# Patient Record
Sex: Male | Born: 1985
Health system: Southern US, Community
[De-identification: ages and names within clinical notes are randomized; demographics above are authoritative.]

## PROBLEM LIST (undated history)

## (undated) DIAGNOSIS — N189 Chronic kidney disease, unspecified: Secondary | ICD-10-CM

---

## 2015-11-04 DIAGNOSIS — N028 Recurrent and persistent hematuria with other morphologic changes: Secondary | ICD-10-CM | POA: Diagnosis not present

## 2015-11-04 DIAGNOSIS — R809 Proteinuria, unspecified: Secondary | ICD-10-CM | POA: Diagnosis not present

## 2015-11-04 DIAGNOSIS — R319 Hematuria, unspecified: Secondary | ICD-10-CM | POA: Diagnosis not present

## 2015-11-10 DIAGNOSIS — R809 Proteinuria, unspecified: Secondary | ICD-10-CM | POA: Diagnosis not present

## 2015-11-10 DIAGNOSIS — R319 Hematuria, unspecified: Secondary | ICD-10-CM | POA: Diagnosis not present

## 2015-11-10 DIAGNOSIS — N028 Recurrent and persistent hematuria with other morphologic changes: Secondary | ICD-10-CM | POA: Diagnosis not present

## 2016-01-13 DIAGNOSIS — N028 Recurrent and persistent hematuria with other morphologic changes: Secondary | ICD-10-CM | POA: Diagnosis not present

## 2016-02-27 DIAGNOSIS — R809 Proteinuria, unspecified: Secondary | ICD-10-CM | POA: Diagnosis not present

## 2016-02-27 DIAGNOSIS — N028 Recurrent and persistent hematuria with other morphologic changes: Secondary | ICD-10-CM | POA: Diagnosis not present

## 2016-03-26 DIAGNOSIS — R809 Proteinuria, unspecified: Secondary | ICD-10-CM | POA: Diagnosis not present

## 2016-03-26 DIAGNOSIS — R319 Hematuria, unspecified: Secondary | ICD-10-CM | POA: Diagnosis not present

## 2016-03-26 DIAGNOSIS — N028 Recurrent and persistent hematuria with other morphologic changes: Secondary | ICD-10-CM | POA: Diagnosis not present

## 2016-05-17 DIAGNOSIS — N028 Recurrent and persistent hematuria with other morphologic changes: Secondary | ICD-10-CM | POA: Diagnosis not present

## 2016-05-17 DIAGNOSIS — R809 Proteinuria, unspecified: Secondary | ICD-10-CM | POA: Diagnosis not present

## 2016-08-30 DIAGNOSIS — N028 Recurrent and persistent hematuria with other morphologic changes: Secondary | ICD-10-CM | POA: Diagnosis not present

## 2016-08-30 DIAGNOSIS — R809 Proteinuria, unspecified: Secondary | ICD-10-CM | POA: Diagnosis not present

## 2016-09-05 DIAGNOSIS — R809 Proteinuria, unspecified: Secondary | ICD-10-CM | POA: Diagnosis not present

## 2016-09-05 DIAGNOSIS — R319 Hematuria, unspecified: Secondary | ICD-10-CM | POA: Diagnosis not present

## 2016-09-05 DIAGNOSIS — N028 Recurrent and persistent hematuria with other morphologic changes: Secondary | ICD-10-CM | POA: Diagnosis not present

## 2017-05-24 DIAGNOSIS — I10 Essential (primary) hypertension: Secondary | ICD-10-CM | POA: Diagnosis not present

## 2017-05-24 DIAGNOSIS — Z6826 Body mass index (BMI) 26.0-26.9, adult: Secondary | ICD-10-CM | POA: Diagnosis not present

## 2017-05-24 DIAGNOSIS — Z1389 Encounter for screening for other disorder: Secondary | ICD-10-CM | POA: Diagnosis not present

## 2017-05-24 DIAGNOSIS — N028 Recurrent and persistent hematuria with other morphologic changes: Secondary | ICD-10-CM | POA: Diagnosis not present

## 2017-07-11 DIAGNOSIS — R809 Proteinuria, unspecified: Secondary | ICD-10-CM | POA: Diagnosis not present

## 2017-07-11 DIAGNOSIS — E785 Hyperlipidemia, unspecified: Secondary | ICD-10-CM | POA: Diagnosis not present

## 2017-07-11 DIAGNOSIS — Z6828 Body mass index (BMI) 28.0-28.9, adult: Secondary | ICD-10-CM | POA: Diagnosis not present

## 2017-07-11 DIAGNOSIS — N028 Recurrent and persistent hematuria with other morphologic changes: Secondary | ICD-10-CM | POA: Diagnosis not present

## 2017-07-19 ENCOUNTER — Other Ambulatory Visit (HOSPITAL_COMMUNITY): Payer: Self-pay | Admitting: Nephrology

## 2017-07-19 DIAGNOSIS — E785 Hyperlipidemia, unspecified: Secondary | ICD-10-CM

## 2017-07-19 DIAGNOSIS — R809 Proteinuria, unspecified: Secondary | ICD-10-CM

## 2017-08-09 ENCOUNTER — Ambulatory Visit (HOSPITAL_COMMUNITY): Payer: BLUE CROSS/BLUE SHIELD

## 2017-08-09 ENCOUNTER — Encounter (HOSPITAL_COMMUNITY): Payer: Self-pay

## 2017-09-16 DIAGNOSIS — J029 Acute pharyngitis, unspecified: Secondary | ICD-10-CM | POA: Diagnosis not present

## 2017-09-17 DIAGNOSIS — J020 Streptococcal pharyngitis: Secondary | ICD-10-CM | POA: Diagnosis not present

## 2017-09-17 DIAGNOSIS — R809 Proteinuria, unspecified: Secondary | ICD-10-CM | POA: Diagnosis not present

## 2017-09-17 DIAGNOSIS — N028 Recurrent and persistent hematuria with other morphologic changes: Secondary | ICD-10-CM | POA: Diagnosis not present

## 2017-09-17 DIAGNOSIS — E785 Hyperlipidemia, unspecified: Secondary | ICD-10-CM | POA: Diagnosis not present

## 2017-10-01 DIAGNOSIS — R809 Proteinuria, unspecified: Secondary | ICD-10-CM | POA: Diagnosis not present

## 2017-10-09 ENCOUNTER — Other Ambulatory Visit (HOSPITAL_COMMUNITY): Payer: Self-pay | Admitting: Nephrology

## 2017-10-09 DIAGNOSIS — R809 Proteinuria, unspecified: Secondary | ICD-10-CM

## 2017-11-06 ENCOUNTER — Other Ambulatory Visit: Payer: Self-pay | Admitting: Radiology

## 2017-11-07 ENCOUNTER — Other Ambulatory Visit: Payer: Self-pay | Admitting: Radiology

## 2017-11-08 ENCOUNTER — Ambulatory Visit (HOSPITAL_COMMUNITY)
Admission: RE | Admit: 2017-11-08 | Discharge: 2017-11-08 | Disposition: A | Discharge status: Home / Self Care | Payer: BLUE CROSS/BLUE SHIELD | Source: Ambulatory Visit | Attending: Nephrology | Admitting: Nephrology

## 2017-11-08 ENCOUNTER — Encounter (HOSPITAL_COMMUNITY): Payer: Self-pay

## 2017-11-08 DIAGNOSIS — N028 Recurrent and persistent hematuria with other morphologic changes: Secondary | ICD-10-CM | POA: Diagnosis not present

## 2017-11-08 DIAGNOSIS — R809 Proteinuria, unspecified: Secondary | ICD-10-CM

## 2017-11-08 DIAGNOSIS — Z79899 Other long term (current) drug therapy: Secondary | ICD-10-CM | POA: Insufficient documentation

## 2017-11-08 DIAGNOSIS — F17210 Nicotine dependence, cigarettes, uncomplicated: Secondary | ICD-10-CM | POA: Diagnosis not present

## 2017-11-08 DIAGNOSIS — N189 Chronic kidney disease, unspecified: Secondary | ICD-10-CM | POA: Insufficient documentation

## 2017-11-08 DIAGNOSIS — N289 Disorder of kidney and ureter, unspecified: Secondary | ICD-10-CM | POA: Diagnosis not present

## 2017-11-08 HISTORY — DX: Chronic kidney disease, unspecified: N18.9

## 2017-11-08 LAB — CBC
HEMATOCRIT: 36.9 % — AB (ref 39.0–52.0)
Hemoglobin: 12.9 g/dL — ABNORMAL LOW (ref 13.0–17.0)
MCH: 30.4 pg (ref 26.0–34.0)
MCHC: 35 g/dL (ref 30.0–36.0)
MCV: 87 fL (ref 78.0–100.0)
PLATELETS: 244 10*3/uL (ref 150–400)
RBC: 4.24 MIL/uL (ref 4.22–5.81)
RDW: 12.4 % (ref 11.5–15.5)
WBC: 7.6 10*3/uL (ref 4.0–10.5)

## 2017-11-08 LAB — PROTIME-INR
INR: 0.97
Prothrombin Time: 12.8 seconds (ref 11.4–15.2)

## 2017-11-08 MED ORDER — FENTANYL CITRATE (PF) 100 MCG/2ML IJ SOLN
INTRAMUSCULAR | Status: AC | PRN
  Administered 2017-11-08: 50 ug via INTRAVENOUS
  Administered 2017-11-08: 100 ug via INTRAVENOUS

## 2017-11-08 MED ORDER — SODIUM CHLORIDE 0.9 % IV SOLN: INTRAVENOUS | Status: DC

## 2017-11-08 MED ORDER — MIDAZOLAM HCL 2 MG/2ML IJ SOLN
INTRAMUSCULAR | Status: AC
  Filled 2017-11-08: qty 2

## 2017-11-08 MED ORDER — FENTANYL CITRATE (PF) 100 MCG/2ML IJ SOLN
INTRAMUSCULAR | Status: AC
  Filled 2017-11-08: qty 2

## 2017-11-08 MED ORDER — SODIUM CHLORIDE 0.9 % IV SOLN
INTRAVENOUS | Status: AC | PRN
  Administered 2017-11-08: 10 mL/h via INTRAVENOUS

## 2017-11-08 MED ORDER — LIDOCAINE HCL (PF) 1 % IJ SOLN
INTRAMUSCULAR | Status: AC
  Filled 2017-11-08: qty 30

## 2017-11-08 MED ORDER — GELATIN ABSORBABLE 12-7 MM EX MISC
CUTANEOUS | Status: AC
  Filled 2017-11-08: qty 1

## 2017-11-08 MED ORDER — MIDAZOLAM HCL 2 MG/2ML IJ SOLN
INTRAMUSCULAR | Status: AC | PRN
  Administered 2017-11-08: 1 mg via INTRAVENOUS
  Administered 2017-11-08 (×2): 0.5 mg via INTRAVENOUS

## 2017-11-08 NOTE — Procedures (Signed)
Interventional Radiology Procedure Note  Procedure: US guided biopsy of the left kidney, medical renal. 2x16g core Complications: None Recommendations:  - Ok to shower tomorrow - Do not submerge for 7 days - Routine wound care - 2 hour dc home - advance diet   Signed,  Yvone NeuJaime S. Loreta AveWagner, DO

## 2017-11-08 NOTE — H&P (Signed)
Chief Complaint: Patient was seen in consultation today for random renal biopsy at the request of Dunham,Cynthia  Referring Physician(s): Dunham,Cynthia  Supervising Physician: Gilmer Mor  Patient Status: [facility] - Out-pt  History of Present Illness: Chris Armstrong is a 32 y.o. male   Known proteinuria Intermittent hematuria since 2013 Renal biopsy in [place] 2016: Focal sclerosing IgA nephropathy; Mild interstitial fibrosis  Follows here with Dr Eliott [facility] Scheduled for random renal biopsy   Past Medical History:  Diagnosis Date  . Chronic kidney disease     History reviewed. No pertinent surgical history.  Allergies: Patient has no known allergies.  Medications: Prior to Admission medications   Medication Sig Start Date End Date Taking? Authorizing Provider  atorvastatin (LIPITOR) 20 MG tablet Take 20 mg by mouth every morning.   Yes [provider]  losartan (COZAAR) 25 MG tablet Take 25 mg by mouth 2 (two) times daily.   Yes [provider]  Omega-3 1000 MG CAPS Take 3 capsules by mouth daily.   Yes [provider]  Probiotic Product (PROBIOTIC DAILY PO) Take 1 capsule by mouth daily.   Yes [provider]     Family History  Problem Relation Age of Onset  . Cancer Mother     Social History   Socioeconomic History  . Marital status: Married    Spouse name: Not on file  . Number of children: Not on file  . Years of education: Not on file  . Highest education level: Not on file  Occupational History  . Not on file  Social Needs  . Financial resource strain: Not on file  . Food insecurity:    Worry: Not on file    Inability: Not on file  . Transportation needs:    Medical: Not on file    Non-medical: Not on file  Tobacco Use  . Smoking status: Light Tobacco Smoker    Types: Cigars  . Smokeless tobacco: Never Used  Substance and Sexual Activity  . Alcohol use: Yes    Comment: occasional   . Drug use: Never  . Sexual activity: Not on file  Lifestyle  . Physical activity:    Days per week: Not on file    Minutes per session: Not on file  . Stress: Not on file  Relationships  . Social connections:    Talks on phone: Not on file    Gets together: Not on file    Attends religious service: Not on file    Active member of club or organization: Not on file    Attends meetings of clubs or organizations: Not on file    Relationship status: Not on file  Other Topics Concern  . Not on file  Social History Narrative  . Not on file    Review of Systems: A 12 point ROS discussed and pertinent positives are indicated in the HPI above.  All other systems are negative.  Review of Systems  Constitutional: Negative for activity change, fatigue and fever.  Respiratory: Negative for cough and shortness of breath.   Cardiovascular: Negative for chest pain.  Gastrointestinal: Negative for abdominal pain.  Musculoskeletal: Negative for back pain.  Neurological: Negative for weakness.  Psychiatric/Behavioral: Negative for behavioral problems and confusion.    Vital Signs: BP 121/75   Pulse 63   Temp 98.1 F (36.7 C) (Oral)   Resp 16   Ht 6' (1.829 m)   Wt 195 lb (88.5 kg)   SpO2 100%  BMI 26.45 kg/m   Physical Exam  Constitutional: He is oriented to person, place, and time. He appears well-nourished.  Cardiovascular: Normal rate, regular rhythm and normal heart sounds.  Pulmonary/Chest: Effort normal and breath sounds normal.  Abdominal: Soft. Bowel sounds are normal.  Musculoskeletal: Normal range of motion.  Neurological: He is alert and oriented to person, place, and time.  Skin: Skin is warm and dry.  Psychiatric: He has a normal mood and affect. His behavior is normal. Judgment and thought content normal.  Nursing note and vitals reviewed.   Imaging: No results found.  Labs:  CBC: Recent Labs    11/08/17 0630  WBC 7.6  HGB 12.9*  HCT 36.9*  PLT 244      COAGS: Recent Labs    11/08/17 0630  INR 0.97    BMP: No results for input(s): NA, K, CL, CO2, GLUCOSE, BUN, CALCIUM, CREATININE, GFRNONAA, GFRAA in the last 8760 hours.  Invalid input(s): CMP  LIVER FUNCTION TESTS: No results for input(s): BILITOT, AST, ALT, ALKPHOS, PROT, ALBUMIN in the last 8760 hours.  TUMOR MARKERS: No results for input(s): AFPTM, CEA, CA199, CHROMGRNA in the last 8760 hours.  Assessment and Plan:  Proteinuria Hematuria Scheduled for random renal biopsy Risks and benefits discussed with the patient including, but not limited to bleeding, infection, damage to adjacent structures or low yield requiring additional tests.  All of the patient's questions were answered, patient is agreeable to proceed. Consent signed and in chart.  Thank you for this interesting consult.  I greatly enjoyed meeting Chris Armstrong and look forward to participating in their care.  A copy of this report was sent to the requesting provider on this date.  Electronically Signed: Robet LeuURPIN,Mateus Rewerts A, PA-C 11/08/2017, 7:29 AM   I spent a total of  30 Minutes   in face to face in clinical consultation, greater than 50% of which was counseling/coordinating care for random renal bx

## 2017-11-08 NOTE — Discharge Instructions (Addendum)

## 2017-11-08 NOTE — Sedation Documentation (Signed)
Patient is resting comfortably. 

## 2017-11-28 ENCOUNTER — Encounter (HOSPITAL_COMMUNITY): Payer: Self-pay | Admitting: Chiropractic Medicine

## 2018-01-22 ENCOUNTER — Other Ambulatory Visit: Payer: Self-pay | Admitting: Nephrology

## 2018-01-22 DIAGNOSIS — N028 Recurrent and persistent hematuria with other morphologic changes: Secondary | ICD-10-CM

## 2018-01-28 DIAGNOSIS — N028 Recurrent and persistent hematuria with other morphologic changes: Secondary | ICD-10-CM | POA: Diagnosis not present

## 2018-01-28 DIAGNOSIS — I10 Essential (primary) hypertension: Secondary | ICD-10-CM | POA: Diagnosis not present

## 2018-01-28 DIAGNOSIS — E785 Hyperlipidemia, unspecified: Secondary | ICD-10-CM | POA: Diagnosis not present

## 2018-01-28 DIAGNOSIS — N049 Nephrotic syndrome with unspecified morphologic changes: Secondary | ICD-10-CM | POA: Diagnosis not present

## 2018-01-28 DIAGNOSIS — R809 Proteinuria, unspecified: Secondary | ICD-10-CM | POA: Diagnosis not present

## 2018-01-31 ENCOUNTER — Other Ambulatory Visit: Payer: BLUE CROSS/BLUE SHIELD

## 2018-04-29 DIAGNOSIS — N049 Nephrotic syndrome with unspecified morphologic changes: Secondary | ICD-10-CM | POA: Diagnosis not present

## 2018-04-29 DIAGNOSIS — I10 Essential (primary) hypertension: Secondary | ICD-10-CM | POA: Diagnosis not present

## 2018-04-29 DIAGNOSIS — E785 Hyperlipidemia, unspecified: Secondary | ICD-10-CM | POA: Diagnosis not present

## 2018-04-29 DIAGNOSIS — N028 Recurrent and persistent hematuria with other morphologic changes: Secondary | ICD-10-CM | POA: Diagnosis not present

## 2018-07-02 DIAGNOSIS — E785 Hyperlipidemia, unspecified: Secondary | ICD-10-CM | POA: Diagnosis not present

## 2018-07-02 DIAGNOSIS — N028 Recurrent and persistent hematuria with other morphologic changes: Secondary | ICD-10-CM | POA: Diagnosis not present

## 2018-07-02 DIAGNOSIS — I10 Essential (primary) hypertension: Secondary | ICD-10-CM | POA: Diagnosis not present

## 2018-12-29 DIAGNOSIS — N049 Nephrotic syndrome with unspecified morphologic changes: Secondary | ICD-10-CM | POA: Diagnosis not present

## 2018-12-29 DIAGNOSIS — E785 Hyperlipidemia, unspecified: Secondary | ICD-10-CM | POA: Diagnosis not present

## 2018-12-29 DIAGNOSIS — N028 Recurrent and persistent hematuria with other morphologic changes: Secondary | ICD-10-CM | POA: Diagnosis not present

## 2018-12-31 DIAGNOSIS — I10 Essential (primary) hypertension: Secondary | ICD-10-CM | POA: Diagnosis not present

## 2018-12-31 DIAGNOSIS — N028 Recurrent and persistent hematuria with other morphologic changes: Secondary | ICD-10-CM | POA: Diagnosis not present

## 2018-12-31 DIAGNOSIS — E785 Hyperlipidemia, unspecified: Secondary | ICD-10-CM | POA: Diagnosis not present

## 2019-01-30 DIAGNOSIS — Z20828 Contact with and (suspected) exposure to other viral communicable diseases: Secondary | ICD-10-CM | POA: Diagnosis not present

## 2019-04-28 DIAGNOSIS — I10 Essential (primary) hypertension: Secondary | ICD-10-CM | POA: Diagnosis not present

## 2019-04-28 DIAGNOSIS — L237 Allergic contact dermatitis due to plants, except food: Secondary | ICD-10-CM | POA: Diagnosis not present

## 2019-06-05 DIAGNOSIS — Z0000 Encounter for general adult medical examination without abnormal findings: Secondary | ICD-10-CM | POA: Diagnosis not present

## 2019-06-09 DIAGNOSIS — E7800 Pure hypercholesterolemia, unspecified: Secondary | ICD-10-CM | POA: Diagnosis not present

## 2019-06-09 DIAGNOSIS — N028 Recurrent and persistent hematuria with other morphologic changes: Secondary | ICD-10-CM | POA: Diagnosis not present

## 2019-06-09 DIAGNOSIS — N049 Nephrotic syndrome with unspecified morphologic changes: Secondary | ICD-10-CM | POA: Diagnosis not present

## 2019-06-09 DIAGNOSIS — Z1331 Encounter for screening for depression: Secondary | ICD-10-CM | POA: Diagnosis not present

## 2019-06-09 DIAGNOSIS — Z0000 Encounter for general adult medical examination without abnormal findings: Secondary | ICD-10-CM | POA: Diagnosis not present

## 2019-06-09 DIAGNOSIS — I10 Essential (primary) hypertension: Secondary | ICD-10-CM | POA: Diagnosis not present

## 2021-08-28 ENCOUNTER — Other Ambulatory Visit: Payer: Self-pay | Admitting: Family Medicine

## 2021-08-28 DIAGNOSIS — R109 Unspecified abdominal pain: Secondary | ICD-10-CM

## 2021-09-11 ENCOUNTER — Other Ambulatory Visit: Payer: BLUE CROSS/BLUE SHIELD

## 2021-09-19 ENCOUNTER — Ambulatory Visit
Admission: RE | Admit: 2021-09-19 | Discharge: 2021-09-19 | Disposition: A | Discharge status: Home / Self Care | Payer: Commercial Managed Care - PPO | Source: Ambulatory Visit | Attending: Family Medicine | Admitting: Family Medicine

## 2021-09-19 DIAGNOSIS — R109 Unspecified abdominal pain: Secondary | ICD-10-CM

## 2021-09-25 ENCOUNTER — Other Ambulatory Visit: Payer: Self-pay | Admitting: Family Medicine

## 2021-10-09 ENCOUNTER — Other Ambulatory Visit: Payer: Self-pay | Admitting: Family Medicine

## 2021-10-09 DIAGNOSIS — R161 Splenomegaly, not elsewhere classified: Secondary | ICD-10-CM

## 2021-10-09 DIAGNOSIS — R109 Unspecified abdominal pain: Secondary | ICD-10-CM

## 2021-10-25 ENCOUNTER — Ambulatory Visit
Admission: RE | Admit: 2021-10-25 | Discharge: 2021-10-25 | Disposition: A | Discharge status: Home / Self Care | Payer: Commercial Managed Care - PPO | Source: Ambulatory Visit | Attending: Family Medicine | Admitting: Family Medicine

## 2021-10-25 DIAGNOSIS — R109 Unspecified abdominal pain: Secondary | ICD-10-CM

## 2021-10-25 DIAGNOSIS — R161 Splenomegaly, not elsewhere classified: Secondary | ICD-10-CM

## 2021-10-25 MED ORDER — IOPAMIDOL (ISOVUE-300) INJECTION 61%
80.0000 mL | Freq: Once | INTRAVENOUS | Status: AC | PRN
  Administered 2021-10-25: 80 mL via INTRAVENOUS

## 2021-11-02 ENCOUNTER — Other Ambulatory Visit: Payer: Commercial Managed Care - PPO

## 2021-11-13 ENCOUNTER — Other Ambulatory Visit: Payer: Self-pay | Admitting: Family Medicine

## 2021-11-13 ENCOUNTER — Ambulatory Visit
Admission: RE | Admit: 2021-11-13 | Discharge: 2021-11-13 | Disposition: A | Discharge status: Home / Self Care | Payer: Commercial Managed Care - PPO | Source: Ambulatory Visit | Attending: Family Medicine | Admitting: Family Medicine

## 2021-11-13 DIAGNOSIS — M7989 Other specified soft tissue disorders: Secondary | ICD-10-CM

## 2022-01-02 ENCOUNTER — Ambulatory Visit (HOSPITAL_COMMUNITY)
Admission: EM | Admit: 2022-01-02 | Discharge: 2022-01-02 | Disposition: A | Discharge status: Home / Self Care | Payer: 59

## 2022-01-02 ENCOUNTER — Encounter (HOSPITAL_COMMUNITY): Payer: Self-pay

## 2022-01-02 DIAGNOSIS — J029 Acute pharyngitis, unspecified: Secondary | ICD-10-CM

## 2022-01-02 MED ORDER — AMOXICILLIN 500 MG PO CAPS: 500.0000 mg | ORAL_CAPSULE | Freq: Two times a day (BID) | ORAL | 0 refills | Status: AC

## 2022-01-02 NOTE — ED Triage Notes (Signed)
Sore throat for 2 days. Wife and son tested positive for strep.

## 2022-01-02 NOTE — Discharge Instructions (Signed)
Today we will treat you prophylactically for strep as 2 minutes of your current household have it  Take amoxicillin twice daily for the next 10 days, ideally should see him remain in about 48 hours and steady progression from there  You may continue your vitamin C and increase fluid intake if that is helpful  You may also attempt Tylenol or ibuprofen every 6 hours as needed for additional comfort  You may attempt salt water gargles, throat lozenges, Listerine rinses, warm liquids, soft foods, teaspoons of honey, throat lozenges and over-the-counter Chloraseptic spray for additional comfort  You may follow-up with urgent care as needed symptoms continue to persist or worsen

## 2022-01-02 NOTE — ED Provider Notes (Signed)
[facility]    CSN: [id number] Arrival date & time: 01/02/22  1950      History   Chief Complaint Chief Complaint  Patient presents with   Sore Throat    HPI Chris Armstrong is a 36 y.o. male.   Patient presents with sore throat and nasal congestion for 2 days.  Tolerating food and liquids.  Known sick contacts with strep in household.  Has attempted use of vitamin C and increase fluid intake which has been minimally helpful.  Denies fever, chills, body aches, ear pain, headache, cough, shortness of breath, wheezing, difficulty breathing.  History of CKD.    Past Medical History:  Diagnosis Date   Chronic kidney disease     There are no problems to display for this patient.   History reviewed. No pertinent surgical history.     Home Medications    Prior to Admission medications   Medication Sig Start Date End Date Taking? Authorizing Provider  amoxicillin (AMOXIL) 500 MG capsule Take 1 capsule (500 mg total) by mouth 2 (two) times daily for 10 days. 01/02/22 01/12/22 Yes Ritesh Opara R, NP  atorvastatin (LIPITOR) 20 MG tablet Take 20 mg by mouth every morning.   Yes [provider]  FARXIGA 10 MG TABS tablet Take 10 mg by mouth every morning. 11/15/21  Yes [provider]  losartan (COZAAR) 25 MG tablet Take 25 mg by mouth 2 (two) times daily.   Yes [provider]  Omega-3 1000 MG CAPS Take 3 capsules by mouth daily.    [provider]  Probiotic Product (PROBIOTIC DAILY PO) Take 1 capsule by mouth daily.    [provider]    Family History Family History  Problem Relation Age of Onset   Cancer Mother     Social History Social History   Tobacco Use   Smoking status: Light Smoker    Types: Cigars   Smokeless tobacco: Never  Vaping Use   Vaping Use: Never used  Substance Use Topics   Alcohol use: Yes    Comment: occasional   Drug use: Never     Allergies   Patient has no known  allergies.   Review of Systems Review of Systems Defer to HPI   Physical Exam Triage Vital Signs ED Triage Vitals  Enc Vitals Group     BP 01/02/22 2001 (!) 134/92     Pulse Rate 01/02/22 2001 68     Resp --      Temp 01/02/22 2001 98.2 F (36.8 C)     Temp Source 01/02/22 2001 Oral     SpO2 01/02/22 2001 98 %     Weight 01/02/22 2000 190 lb (86.2 kg)     Height 01/02/22 2000 6' (1.829 m)     Head Circumference --      Peak Flow --      Pain Score 01/02/22 1959 6     Pain Loc --      Pain Edu? --      Excl. in GC? --    No data found.  Updated Vital Signs BP (!) 134/92 (BP Location: Right Arm)   Pulse 68   Temp 98.2 F (36.8 C) (Oral)   Ht 6' (1.829 m)   Wt 190 lb (86.2 kg)   SpO2 98%   BMI 25.77 kg/m   Visual Acuity Right Eye Distance:   Left Eye Distance:   Bilateral Distance:    Right Eye Near:  Left Eye Near:    Bilateral Near:     Physical Exam Constitutional:      Appearance: He is well-developed.  HENT:     Head: Normocephalic.     Right Ear: Tympanic membrane and ear canal normal.     Left Ear: Tympanic membrane and ear canal normal.     Nose: Congestion present. No rhinorrhea.     Mouth/Throat:     Mouth: Mucous membranes are moist.     Pharynx: Posterior oropharyngeal erythema present.     Tonsils: No tonsillar exudate. 0 on the right. 0 on the left.  Cardiovascular:     Rate and Rhythm: Normal rate and regular rhythm.     Heart sounds: Normal heart sounds.  Pulmonary:     Effort: Pulmonary effort is normal.     Breath sounds: Normal breath sounds.  Musculoskeletal:     Cervical back: Normal range of motion and neck supple.  Skin:    General: Skin is warm and dry.  Neurological:     General: No focal deficit present.     Mental Status: He is alert and oriented to person, place, and time.  Psychiatric:        Mood and Affect: Mood normal.        Behavior: Behavior normal.      UC Treatments / Results  Labs (all labs ordered  are listed, but only abnormal results are displayed) Labs Reviewed - No data to display  EKG   Radiology No results found.  Procedures Procedures (including critical care time)  Medications Ordered in UC Medications - No data to display  Initial Impression / Assessment and Plan / UC Course  I have reviewed the triage vital signs and the nursing notes.  Pertinent labs & imaging results that were available during my care of the patient were reviewed by me and considered in my medical decision making (see chart for details).  Sore throat  Vital signs are stable, patient is in no signs of distress, will prophylactically treat for strep as members of household have current illness, amoxicillin 10-day course prescribed, recommended can continue use of current medications if he deems helpful, may attempt additional over-the-counter medications as needed additional supportive care, may follow-up with his urgent care as Final Clinical Impressions(s) / UC Diagnoses   Final diagnoses:  Sore throat     Discharge Instructions      Today we will treat you prophylactically for strep as 2 minutes of your current household have it  Take amoxicillin twice daily for the next 10 days, ideally should see him remain in about 48 hours and steady progression from there  You may continue your vitamin C and increase fluid intake if that is helpful  You may also attempt Tylenol or ibuprofen every 6 hours as needed for additional comfort  You may attempt salt water gargles, throat lozenges, Listerine rinses, warm liquids, soft foods, teaspoons of honey, throat lozenges and over-the-counter Chloraseptic spray for additional comfort  You may follow-up with urgent care as needed symptoms continue to persist or worsen   ED Prescriptions     Medication Sig Dispense Auth. Provider   amoxicillin (AMOXIL) 500 MG capsule Take 1 capsule (500 mg total) by mouth 2 (two) times daily for 10 days. 20 capsule  Valinda Hoar, NP      PDMP not reviewed this encounter.   Valinda Hoar, [place] 01/05/22 [phone]

## 2022-08-29 ENCOUNTER — Other Ambulatory Visit: Payer: Self-pay | Admitting: Gastroenterology

## 2022-08-29 DIAGNOSIS — R899 Unspecified abnormal finding in specimens from other organs, systems and tissues: Secondary | ICD-10-CM

## 2022-09-25 ENCOUNTER — Other Ambulatory Visit: Payer: 59

## 2022-10-08 ENCOUNTER — Other Ambulatory Visit (HOSPITAL_COMMUNITY): Payer: Self-pay | Admitting: Gastroenterology

## 2022-10-08 DIAGNOSIS — R748 Abnormal levels of other serum enzymes: Secondary | ICD-10-CM

## 2022-11-06 ENCOUNTER — Encounter (HOSPITAL_COMMUNITY): Payer: Self-pay

## 2022-11-06 ENCOUNTER — Encounter (HOSPITAL_COMMUNITY): Payer: Managed Care, Other (non HMO)

## 2022-11-26 ENCOUNTER — Encounter (HOSPITAL_COMMUNITY)
Admission: RE | Admit: 2022-11-26 | Discharge: 2022-11-26 | Disposition: A | Discharge status: Home / Self Care | Payer: Managed Care, Other (non HMO) | Source: Ambulatory Visit | Attending: Gastroenterology | Admitting: Gastroenterology

## 2022-11-26 DIAGNOSIS — R748 Abnormal levels of other serum enzymes: Secondary | ICD-10-CM | POA: Diagnosis present

## 2022-11-26 MED ORDER — TECHNETIUM TC 99M MEDRONATE IV KIT
21.2000 | PACK | Freq: Once | INTRAVENOUS | Status: AC | PRN
  Administered 2022-11-26: 21.2 via INTRAVENOUS

## 2023-09-03 IMAGING — CR DG FOOT COMPLETE 3+V*R*
3 series · 3 of 3 positions shown · non-contrast
Comparison: None.

CLINICAL DATA: Right foot swelling

EXAM:
RIGHT FOOT COMPLETE - 3+ VIEW

[x foot ap right]
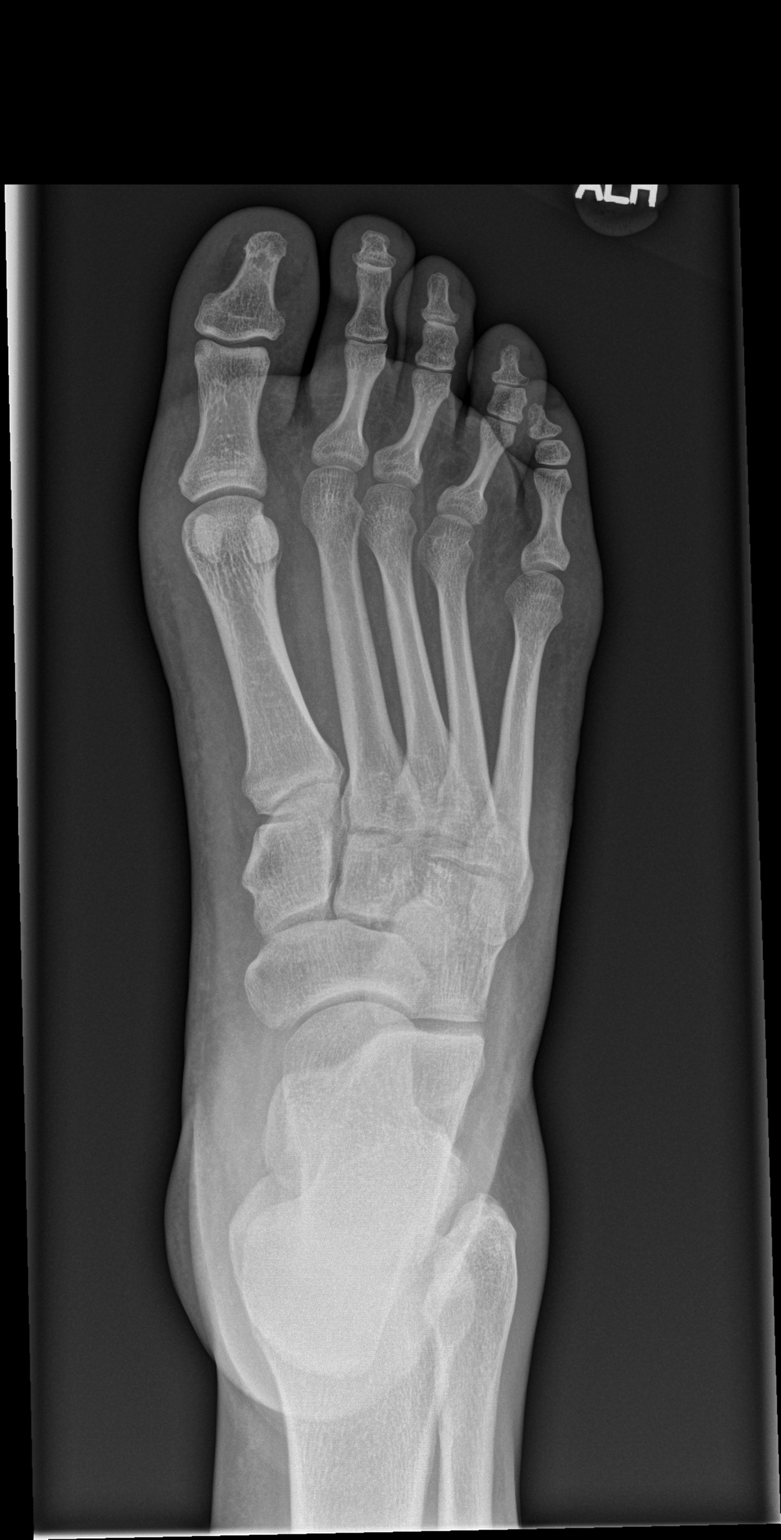

[x foot obl right]
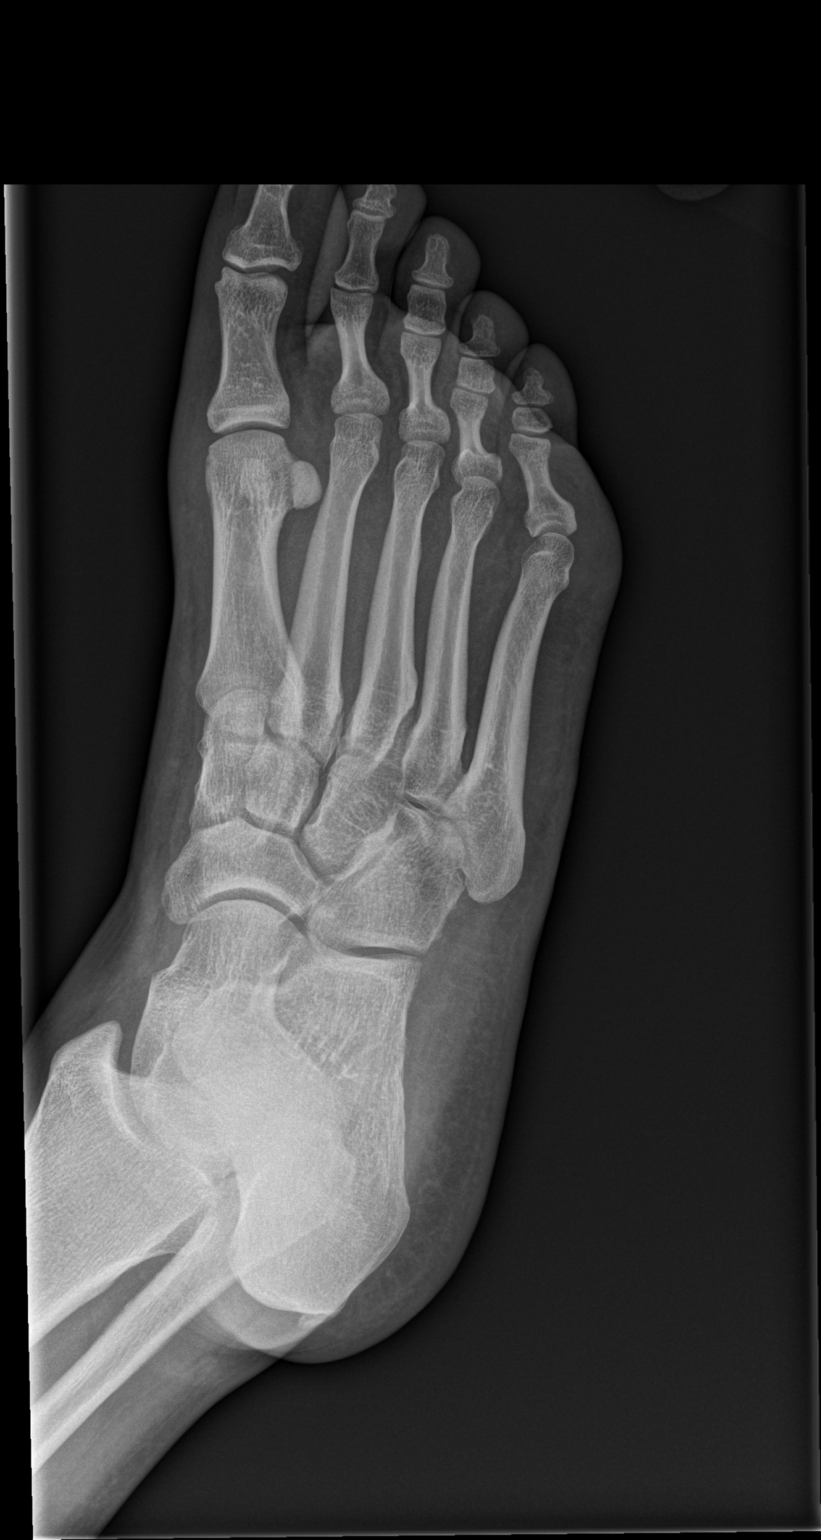

[x foot lat right]
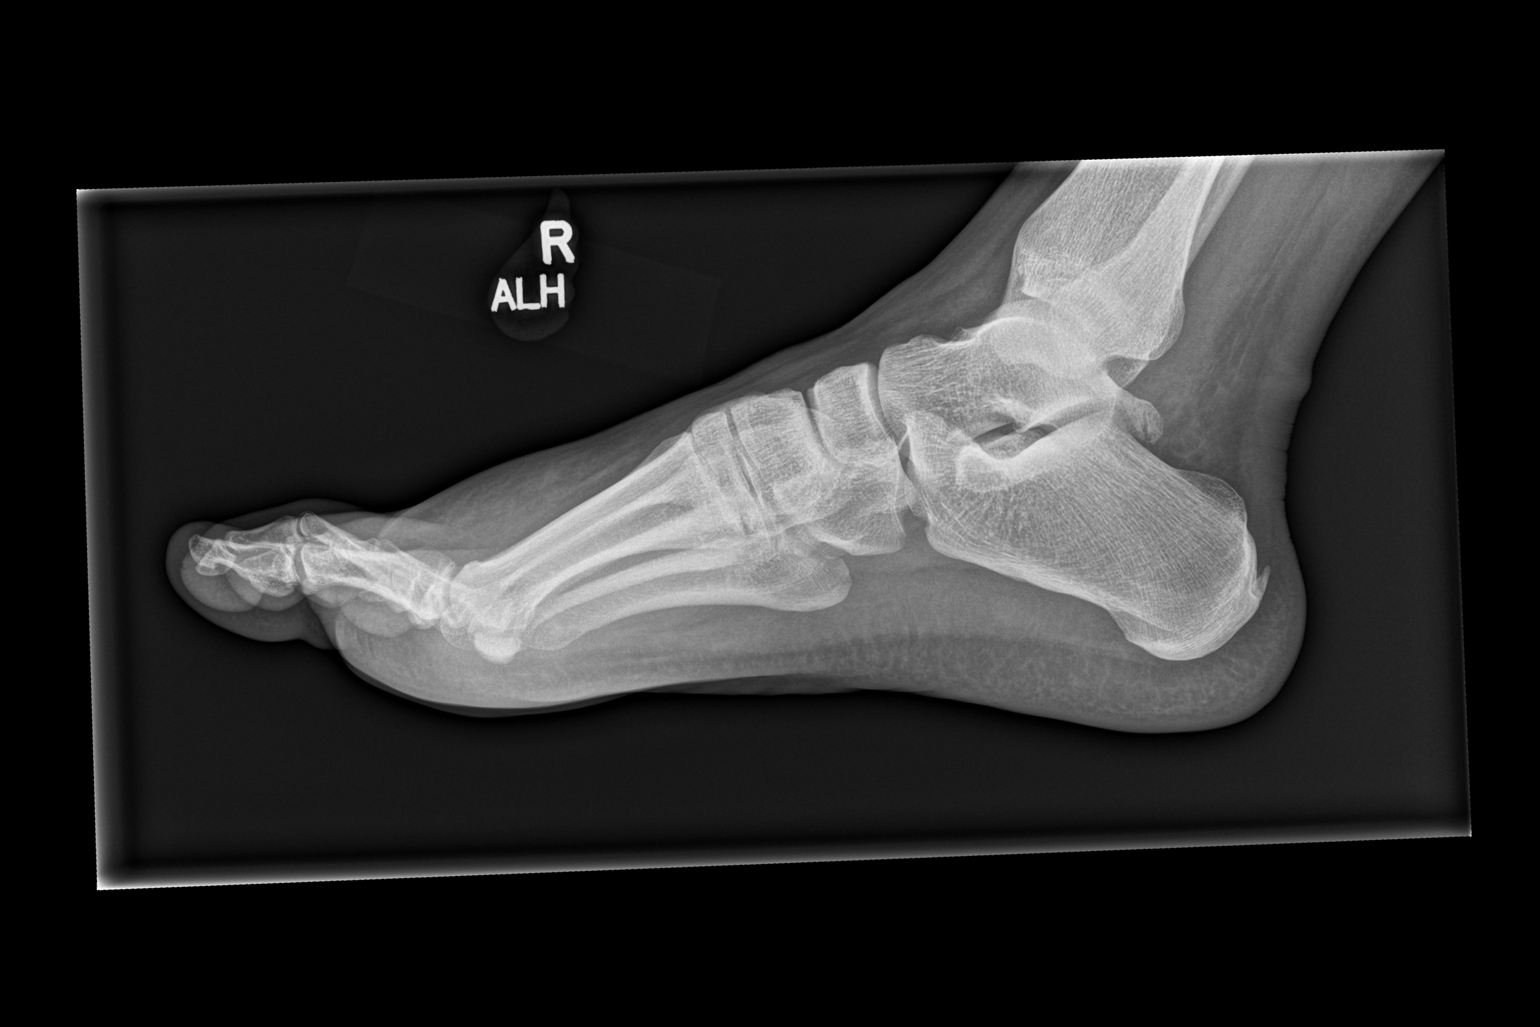

[3 of 3 positions shown; findings below may reference images not displayed]

FINDINGS: Normal alignment. No acute fracture or dislocation. Joint spaces are
preserved. There is moderate soft tissue swelling of the right
forefoot. Tiny superior calcaneal spur.
IMPRESSION: Soft tissue swelling of the right forefoot. No fracture or
dislocation.

## 2023-10-31 ENCOUNTER — Other Ambulatory Visit: Payer: Self-pay | Admitting: Nephrology

## 2023-10-31 ENCOUNTER — Ambulatory Visit
Admission: RE | Admit: 2023-10-31 | Discharge: 2023-10-31 | Disposition: A | Discharge status: Home / Self Care | Source: Ambulatory Visit | Attending: Nephrology | Admitting: Nephrology

## 2023-10-31 DIAGNOSIS — Z111 Encounter for screening for respiratory tuberculosis: Secondary | ICD-10-CM

## 2023-11-06 ENCOUNTER — Other Ambulatory Visit: Payer: Self-pay | Admitting: Nephrology

## 2023-11-06 ENCOUNTER — Ambulatory Visit
Admission: RE | Admit: 2023-11-06 | Discharge: 2023-11-06 | Disposition: A | Discharge status: Home / Self Care | Source: Ambulatory Visit | Attending: Nephrology | Admitting: Nephrology

## 2023-11-06 DIAGNOSIS — K5900 Constipation, unspecified: Secondary | ICD-10-CM

## 2023-11-07 ENCOUNTER — Encounter (HOSPITAL_COMMUNITY)
Admission: RE | Disposition: A | Discharge status: Home / Self Care | Payer: Self-pay | Source: Home / Self Care | Attending: Nephrology

## 2023-11-07 ENCOUNTER — Other Ambulatory Visit: Payer: Self-pay

## 2023-11-07 ENCOUNTER — Ambulatory Visit (HOSPITAL_COMMUNITY)
Admission: RE | Admit: 2023-11-07 | Discharge: 2023-11-07 | Disposition: A | Discharge status: Home / Self Care | Attending: Nephrology | Admitting: Nephrology

## 2023-11-07 DIAGNOSIS — Z79899 Other long term (current) drug therapy: Secondary | ICD-10-CM | POA: Insufficient documentation

## 2023-11-07 DIAGNOSIS — I120 Hypertensive chronic kidney disease with stage 5 chronic kidney disease or end stage renal disease: Secondary | ICD-10-CM | POA: Insufficient documentation

## 2023-11-07 DIAGNOSIS — F17290 Nicotine dependence, other tobacco product, uncomplicated: Secondary | ICD-10-CM | POA: Insufficient documentation

## 2023-11-07 DIAGNOSIS — N186 End stage renal disease: Secondary | ICD-10-CM | POA: Diagnosis not present

## 2023-11-07 DIAGNOSIS — D631 Anemia in chronic kidney disease: Secondary | ICD-10-CM | POA: Insufficient documentation

## 2023-11-07 DIAGNOSIS — Z992 Dependence on renal dialysis: Secondary | ICD-10-CM | POA: Insufficient documentation

## 2023-11-07 HISTORY — PX: DIALYSIS/PERMA CATHETER INSERTION: CATH118288

## 2023-11-07 SURGERY — DIALYSIS/PERMA CATHETER INSERTION
Anesthesia: LOCAL

## 2023-11-07 MED ORDER — LIDOCAINE HCL (PF) 1 % IJ SOLN
INTRAMUSCULAR | Status: AC
  Filled 2023-11-07: qty 30

## 2023-11-07 MED ORDER — LIDOCAINE HCL (PF) 1 % IJ SOLN
INTRAMUSCULAR | Status: DC | PRN
  Administered 2023-11-07: 2 mL via SUBCUTANEOUS
  Administered 2023-11-07: 10 mL via SUBCUTANEOUS
  Administered 2023-11-07: 2 mL via SUBCUTANEOUS

## 2023-11-07 MED ORDER — IODIXANOL 320 MG/ML IV SOLN
INTRAVENOUS | Status: DC | PRN
  Administered 2023-11-07: 2 mL via INTRAVENOUS

## 2023-11-07 MED ORDER — FENTANYL CITRATE (PF) 100 MCG/2ML IJ SOLN
INTRAMUSCULAR | Status: AC
  Filled 2023-11-07: qty 2

## 2023-11-07 MED ORDER — HEPARIN SODIUM (PORCINE) 1000 UNIT/ML IJ SOLN
INTRAMUSCULAR | Status: AC
  Filled 2023-11-07: qty 10

## 2023-11-07 MED ORDER — ACETAMINOPHEN 325 MG PO TABS
ORAL_TABLET | ORAL | Status: AC
  Administered 2023-11-07: 650 mg via ORAL
  Filled 2023-11-07: qty 2

## 2023-11-07 MED ORDER — MIDAZOLAM HCL 2 MG/2ML IJ SOLN
INTRAMUSCULAR | Status: DC | PRN
  Administered 2023-11-07: 2 mg via INTRAVENOUS

## 2023-11-07 MED ORDER — MIDAZOLAM HCL 2 MG/2ML IJ SOLN
INTRAMUSCULAR | Status: AC
  Filled 2023-11-07: qty 2

## 2023-11-07 MED ORDER — ACETAMINOPHEN 325 MG PO TABS: 650.0000 mg | ORAL_TABLET | Freq: Four times a day (QID) | ORAL | Status: DC | PRN

## 2023-11-07 MED ORDER — SODIUM CHLORIDE 0.9 % IV SOLN: INTRAVENOUS | Status: DC

## 2023-11-07 MED ORDER — HEPARIN (PORCINE) IN NACL 1000-0.9 UT/500ML-% IV SOLN
INTRAVENOUS | Status: DC | PRN
  Administered 2023-11-07: 500 mL

## 2023-11-07 MED ORDER — HEPARIN SODIUM (PORCINE) 1000 UNIT/ML IJ SOLN
INTRAMUSCULAR | Status: DC | PRN
  Administered 2023-11-07 (×2): 1900 [IU] via INTRAVENOUS

## 2023-11-07 MED ORDER — FENTANYL CITRATE (PF) 100 MCG/2ML IJ SOLN
INTRAMUSCULAR | Status: DC | PRN
  Administered 2023-11-07: 50 ug via INTRAVENOUS

## 2023-11-07 SURGICAL SUPPLY — 6 items
BAG SNAP BAND KOVER 36X36 (MISCELLANEOUS) IMPLANT
CATH PALINDROME-P 23 W/VT (CATHETERS) IMPLANT
GLIDEWIRE STIFF .35X180X3 HYDR (WIRE) IMPLANT
PACK EP LF (CUSTOM PROCEDURE TRAY) IMPLANT
SHEATH PROBE COVER 6X72 (BAG) IMPLANT
WIRE MICRO SET SILHO 5FR 7 (SHEATH) IMPLANT

## 2023-11-07 NOTE — H&P (Signed)
 Chief Complaint: Need for dialysis access, PD catheter malfunction HPI:  38 year old man with past medical history significant for hypertension and end-stage renal disease who presents for placement of a tunneled hemodialysis catheter.  He was in the process of being trained for peritoneal dialysis however, started having catheter malfunction last week leading to manipulation/repositioning this week but problems with catheter function persist with poor drainage and evidence of volume excess including scrotal edema.  Other than for mild postoperative abdominal discomfort and scrotal discomfort, he does not voice any complaints.  He denies any chest pain or shortness of breath at this time.  The procedure was explained to him and he consents to proceed after weighing risks and benefits.  Past Medical History:  Diagnosis Date   Chronic kidney disease     No past surgical history on file.  Family History  Problem Relation Age of Onset   Cancer Mother    Social History:  reports that he has been smoking cigars. He has never used smokeless tobacco. He reports current alcohol use. He reports that he does not use drugs.  Allergies: No Known Allergies  Medications Prior to Admission  Medication Sig Dispense Refill   atorvastatin (LIPITOR) 20 MG tablet Take 20 mg by mouth every morning.     FARXIGA 10 MG TABS tablet Take 10 mg by mouth every morning.     losartan (COZAAR) 25 MG tablet Take 25 mg by mouth 2 (two) times daily.     Omega-3 1000 MG CAPS Take 3 capsules by mouth daily.     Probiotic Product (PROBIOTIC DAILY PO) Take 1 capsule by mouth daily.      No results found for this or any previous visit (from the past 48 hours). No results found.  Review of Systems  All other systems reviewed and are negative.   There were no vitals taken for this visit. Physical Exam Vitals and nursing note reviewed.  Constitutional:      Appearance: Normal appearance. He is normal weight.  HENT:      Head: Normocephalic and atraumatic.     Right Ear: External ear normal.     Left Ear: External ear normal.     Nose: Nose normal.     Mouth/Throat:     Mouth: Mucous membranes are dry.     Pharynx: Oropharynx is clear.  Eyes:     Extraocular Movements: Extraocular movements intact.     Conjunctiva/sclera: Conjunctivae normal.  Cardiovascular:     Rate and Rhythm: Normal rate and regular rhythm.     Heart sounds: Normal heart sounds.  Abdominal:     General: Abdomen is flat. There is distension.     Comments: Mild distention, PD catheter in situ  Neurological:     Mental Status: He is alert.      Assessment/Plan 1.  End-stage renal disease: Unfortunately with PD catheter malfunction and will have tunneled hemodialysis catheter placement today with the intention of starting him on hemodialysis via the transitional care unit tomorrow. 2.  Hypertension: Blood pressures will be monitored through the procedure with moderate sedation. 3.  Volume overload: With evidence of scrotal edema/fluid retention from suboptimal PD catheter function.  Monitor with hemodialysis/ultrafiltration.   4.  Anemia: Denies overt blood loss, will monitor H/H and ESA at dialysis.  Dagoberto Ligas, MD 11/07/2023, 1:01 PM

## 2023-11-07 NOTE — Op Note (Addendum)
 Patient presents for tunneled catheter insertion to initiate hemodialysis; ultrasound shows a good right IJ. The decision was made to place a RIJ tunneled dialysis catheter.  Summary:  1) Successful placement of a new 23 cm cuff to tip hemodialysis catheter (Palindrome) in the right internal jugular vein with the tip in the right atrium.  2) Recommendations to the dialysis unit TO NOT USE HEPARIN FOR THE FIRST 24 HOURS.  Description of procedure: The right neck, chest and the catheter were prepped and draped in the usual sterile fashion.  Local anesthesia was provided by injecting lidocaine 1% at the neck site overlying the desired venotomy site. Using real time ultrasound guidance, I was able to cannulate the RIJ and advance the guidewire easily into the central circulation. The wire was then manipulated and advanced into the abdominal IVC. I then blunt dissected the tissue around the 5 Fr stylet until it was freely mobile. Then, after injecting local anesthesia into the desired track and exit site I made a small incision at the exit site and tunneled a 23 cm cuff to tip Palindrome dialysis catheter, pulling it out at the venotomy site. Sequential dilation with 12, 14, and finally the peelaway sheath were done with real time fluoroscopic guidance ensuring that we were straight always lined with the wire.   Then, I inserted the catheter over the wire and through the sheath. After removing the peelaway sheath, I adjusted the catheter until the tip of the catheter was positioned in the right atrium of the heart. The cuff of the catheter was positioned in the subcutaneous tunnel with the cuff approximately 2 cm from the exit site.   Both limbs of the catheter were aspirated and flushed with excellent flow noted. Both limbs of the catheter were locked with heparin and sterile caps placed. The hub of the catheter was sutured to the chest wall with 2-0 nylon suture.   The neck incision was closed with a vertical  mattress 3-0 plain gut sutures and a purse-string 3-0 plain gut was placed at the tunnel exit site. 2-0 loose nylon sutures secured the hub to the chest wall.                         Sterile dressings were placed, and the patient returned to recovery in stable condition.  Sedation: 2 mg Versed, 50 mcg Fentanyl. Sedation time: 18 minutes  Contrast 2 mL  Monitoring: Because of the patient's comorbid conditions and sedation during the procedure, continuous EKG monitoring and O2 saturation monitoring was performed throughout the procedure by the RN. There were no abnormal arrhythmias encountered.  Complications: None.  Diagnoses:   N18.6 End stage renal disease Z99.2 Dependence on renal dialysis  Procedures Coding:  36558 Tunneled catheter insertion [id number] Ultrasound guidance 16109  Fluoroscopy guidance for catheter insertion. U0454 Contrast  Recommendations: Remove the suture in 3 weeks. 2.   Report any blood flow problems to [facility].  Discharge: The patient was discharged home in stable condition. The patient was given education regarding the care of the catheter and specific instructions in case of any problems.

## 2023-11-08 ENCOUNTER — Encounter (HOSPITAL_COMMUNITY): Payer: Self-pay | Admitting: Nephrology
